# Patient Record
Sex: Male | Born: 1945
Health system: Southern US, Community
[De-identification: ages and names within clinical notes are randomized; demographics above are authoritative.]

---

## 2011-04-04 ENCOUNTER — Other Ambulatory Visit: Payer: Self-pay | Admitting: Occupational Medicine

## 2011-04-04 ENCOUNTER — Ambulatory Visit: Payer: Self-pay

## 2011-04-04 DIAGNOSIS — R7611 Nonspecific reaction to tuberculin skin test without active tuberculosis: Secondary | ICD-10-CM

## 2011-05-16 ENCOUNTER — Ambulatory Visit
Admission: RE | Admit: 2011-05-16 | Discharge: 2011-05-16 | Disposition: A | Discharge status: Home / Self Care | Payer: Medicare Other | Source: Ambulatory Visit | Attending: Family Medicine | Admitting: Family Medicine

## 2011-05-16 ENCOUNTER — Other Ambulatory Visit: Payer: Self-pay | Admitting: Family Medicine

## 2011-05-16 DIAGNOSIS — Z111 Encounter for screening for respiratory tuberculosis: Secondary | ICD-10-CM

## 2012-05-29 DIAGNOSIS — K6289 Other specified diseases of anus and rectum: Secondary | ICD-10-CM | POA: Diagnosis not present

## 2012-07-30 DIAGNOSIS — Z0000 Encounter for general adult medical examination without abnormal findings: Secondary | ICD-10-CM | POA: Diagnosis not present

## 2012-07-30 DIAGNOSIS — Z23 Encounter for immunization: Secondary | ICD-10-CM | POA: Diagnosis not present

## 2012-07-30 DIAGNOSIS — Z136 Encounter for screening for cardiovascular disorders: Secondary | ICD-10-CM | POA: Diagnosis not present

## 2012-07-30 DIAGNOSIS — Z131 Encounter for screening for diabetes mellitus: Secondary | ICD-10-CM | POA: Diagnosis not present

## 2012-07-30 IMAGING — CR DG CHEST 2V
2 series · 2 of 2 positions shown · non-contrast
Comparison: None

CLINICAL DATA: Positive PPD.

CHEST - 2 VIEW

[w chest pa]
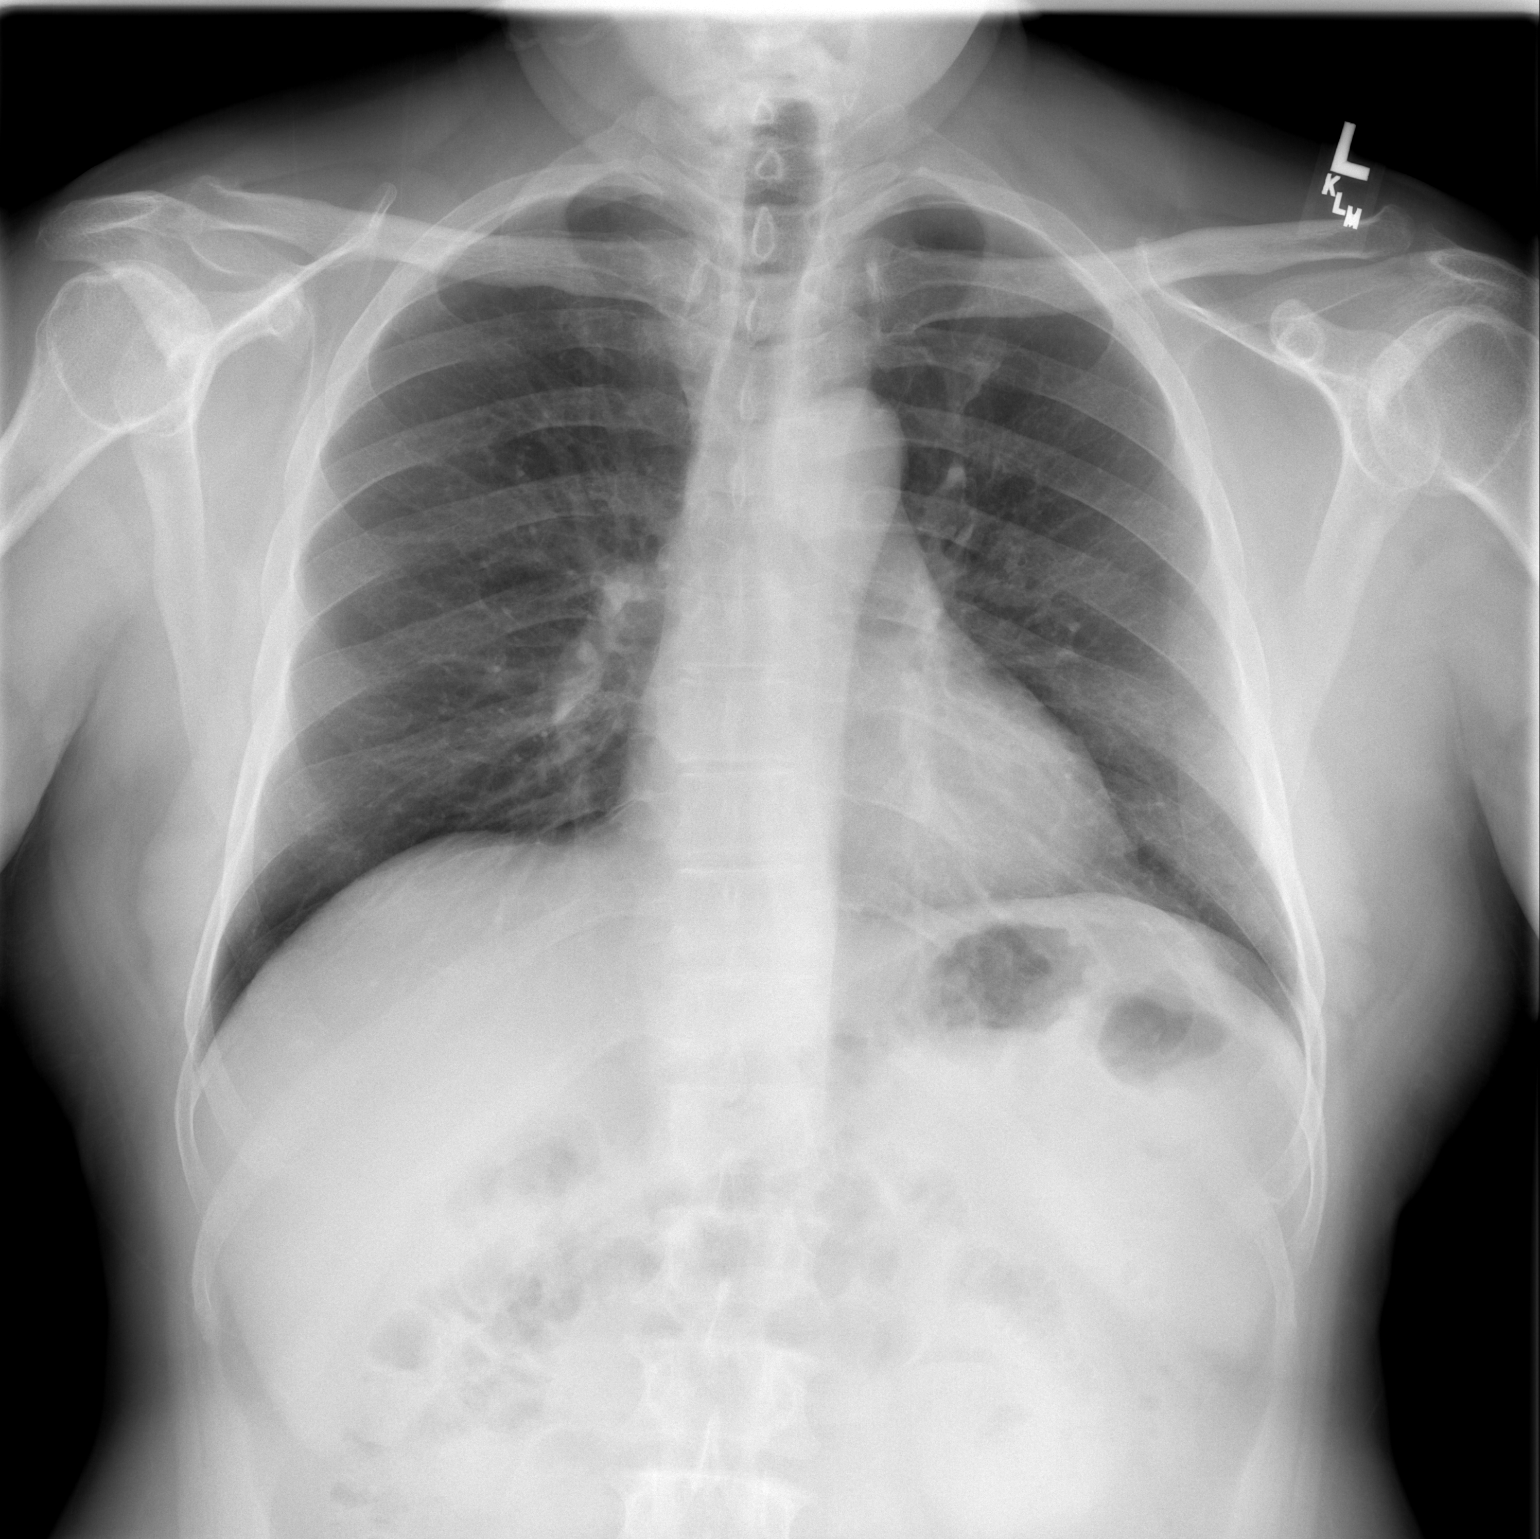

[w chest lat]
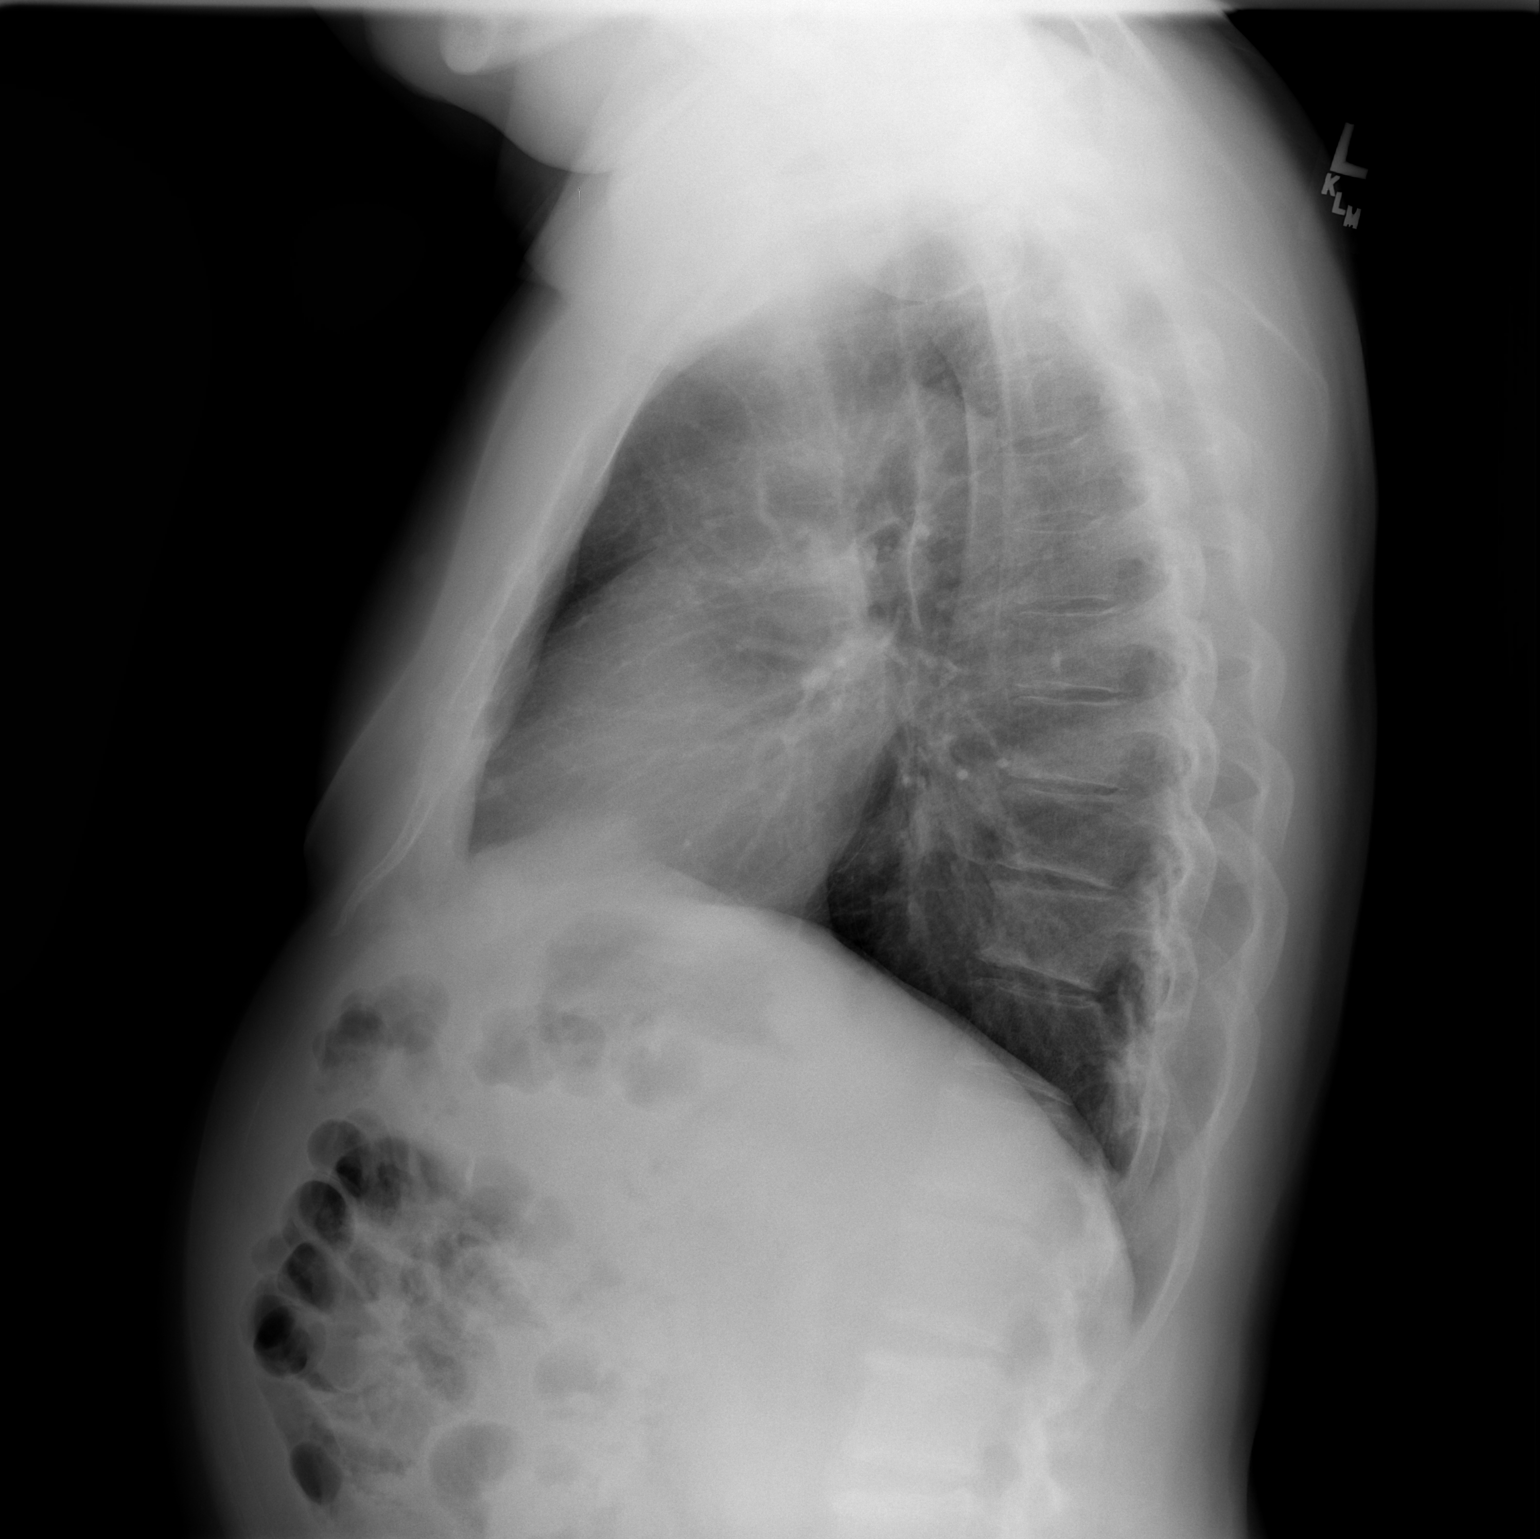

[2 of 2 positions shown; findings below may reference images not displayed]

FINDINGS: The cardiac silhouette, mediastinal and hilar contours
are within normal limits.  The lungs are clear.  The bony thorax is
intact.
IMPRESSION: No acute cardiopulmonary findings.

## 2012-09-23 DIAGNOSIS — Z23 Encounter for immunization: Secondary | ICD-10-CM | POA: Diagnosis not present

## 2013-08-18 DIAGNOSIS — N289 Disorder of kidney and ureter, unspecified: Secondary | ICD-10-CM | POA: Diagnosis not present

## 2013-08-18 DIAGNOSIS — E039 Hypothyroidism, unspecified: Secondary | ICD-10-CM | POA: Diagnosis not present

## 2014-03-03 DIAGNOSIS — Z136 Encounter for screening for cardiovascular disorders: Secondary | ICD-10-CM | POA: Diagnosis not present

## 2014-03-03 DIAGNOSIS — Z23 Encounter for immunization: Secondary | ICD-10-CM | POA: Diagnosis not present

## 2014-03-03 DIAGNOSIS — Z0000 Encounter for general adult medical examination without abnormal findings: Secondary | ICD-10-CM | POA: Diagnosis not present

## 2014-03-03 DIAGNOSIS — K5900 Constipation, unspecified: Secondary | ICD-10-CM | POA: Diagnosis not present

## 2014-03-03 DIAGNOSIS — Z131 Encounter for screening for diabetes mellitus: Secondary | ICD-10-CM | POA: Diagnosis not present

## 2014-03-03 DIAGNOSIS — R300 Dysuria: Secondary | ICD-10-CM | POA: Diagnosis not present

## 2014-06-19 DIAGNOSIS — R6889 Other general symptoms and signs: Secondary | ICD-10-CM | POA: Diagnosis not present

## 2015-04-06 DIAGNOSIS — Z0000 Encounter for general adult medical examination without abnormal findings: Secondary | ICD-10-CM | POA: Diagnosis not present

## 2015-04-06 DIAGNOSIS — Z131 Encounter for screening for diabetes mellitus: Secondary | ICD-10-CM | POA: Diagnosis not present

## 2015-04-06 DIAGNOSIS — R030 Elevated blood-pressure reading, without diagnosis of hypertension: Secondary | ICD-10-CM | POA: Diagnosis not present

## 2015-04-06 DIAGNOSIS — Z136 Encounter for screening for cardiovascular disorders: Secondary | ICD-10-CM | POA: Diagnosis not present

## 2015-04-12 DIAGNOSIS — Z1211 Encounter for screening for malignant neoplasm of colon: Secondary | ICD-10-CM | POA: Diagnosis not present

## 2016-05-02 DIAGNOSIS — R030 Elevated blood-pressure reading, without diagnosis of hypertension: Secondary | ICD-10-CM | POA: Diagnosis not present

## 2016-05-02 DIAGNOSIS — H538 Other visual disturbances: Secondary | ICD-10-CM | POA: Diagnosis not present

## 2016-05-02 DIAGNOSIS — E7800 Pure hypercholesterolemia, unspecified: Secondary | ICD-10-CM | POA: Diagnosis not present

## 2016-05-02 DIAGNOSIS — Z0000 Encounter for general adult medical examination without abnormal findings: Secondary | ICD-10-CM | POA: Diagnosis not present

## 2016-05-02 DIAGNOSIS — Z79899 Other long term (current) drug therapy: Secondary | ICD-10-CM | POA: Diagnosis not present

## 2016-05-05 DIAGNOSIS — Z1211 Encounter for screening for malignant neoplasm of colon: Secondary | ICD-10-CM | POA: Diagnosis not present

## 2016-06-28 DIAGNOSIS — Z961 Presence of intraocular lens: Secondary | ICD-10-CM | POA: Diagnosis not present

## 2016-06-28 DIAGNOSIS — H26491 Other secondary cataract, right eye: Secondary | ICD-10-CM | POA: Diagnosis not present

## 2016-06-28 DIAGNOSIS — H2512 Age-related nuclear cataract, left eye: Secondary | ICD-10-CM | POA: Diagnosis not present

## 2016-06-28 DIAGNOSIS — H25012 Cortical age-related cataract, left eye: Secondary | ICD-10-CM | POA: Diagnosis not present

## 2016-06-28 DIAGNOSIS — H35033 Hypertensive retinopathy, bilateral: Secondary | ICD-10-CM | POA: Diagnosis not present

## 2017-05-07 DIAGNOSIS — Z79899 Other long term (current) drug therapy: Secondary | ICD-10-CM | POA: Diagnosis not present

## 2017-05-07 DIAGNOSIS — Z0000 Encounter for general adult medical examination without abnormal findings: Secondary | ICD-10-CM | POA: Diagnosis not present

## 2017-05-07 DIAGNOSIS — E7800 Pure hypercholesterolemia, unspecified: Secondary | ICD-10-CM | POA: Diagnosis not present

## 2017-05-07 DIAGNOSIS — Z298 Encounter for other specified prophylactic measures: Secondary | ICD-10-CM | POA: Diagnosis not present

## 2017-05-07 DIAGNOSIS — R030 Elevated blood-pressure reading, without diagnosis of hypertension: Secondary | ICD-10-CM | POA: Diagnosis not present

## 2017-05-10 DIAGNOSIS — Z1211 Encounter for screening for malignant neoplasm of colon: Secondary | ICD-10-CM | POA: Diagnosis not present

## 2017-05-10 DIAGNOSIS — Z0000 Encounter for general adult medical examination without abnormal findings: Secondary | ICD-10-CM | POA: Diagnosis not present

## 2017-07-17 DIAGNOSIS — H40013 Open angle with borderline findings, low risk, bilateral: Secondary | ICD-10-CM | POA: Diagnosis not present

## 2017-07-17 DIAGNOSIS — H26491 Other secondary cataract, right eye: Secondary | ICD-10-CM | POA: Diagnosis not present

## 2017-07-17 DIAGNOSIS — Z961 Presence of intraocular lens: Secondary | ICD-10-CM | POA: Diagnosis not present

## 2017-07-17 DIAGNOSIS — H2512 Age-related nuclear cataract, left eye: Secondary | ICD-10-CM | POA: Diagnosis not present

## 2017-08-07 DIAGNOSIS — H26491 Other secondary cataract, right eye: Secondary | ICD-10-CM | POA: Diagnosis not present

## 2017-08-17 DIAGNOSIS — E7800 Pure hypercholesterolemia, unspecified: Secondary | ICD-10-CM | POA: Diagnosis not present

## 2017-08-21 DIAGNOSIS — S86911A Strain of unspecified muscle(s) and tendon(s) at lower leg level, right leg, initial encounter: Secondary | ICD-10-CM | POA: Diagnosis not present

## 2017-09-18 DIAGNOSIS — H25042 Posterior subcapsular polar age-related cataract, left eye: Secondary | ICD-10-CM | POA: Diagnosis not present

## 2017-09-18 DIAGNOSIS — H25012 Cortical age-related cataract, left eye: Secondary | ICD-10-CM | POA: Diagnosis not present

## 2017-09-18 DIAGNOSIS — Z961 Presence of intraocular lens: Secondary | ICD-10-CM | POA: Diagnosis not present

## 2017-09-18 DIAGNOSIS — H35033 Hypertensive retinopathy, bilateral: Secondary | ICD-10-CM | POA: Diagnosis not present

## 2017-09-18 DIAGNOSIS — H2512 Age-related nuclear cataract, left eye: Secondary | ICD-10-CM | POA: Diagnosis not present

## 2017-10-03 DIAGNOSIS — H25812 Combined forms of age-related cataract, left eye: Secondary | ICD-10-CM | POA: Diagnosis not present

## 2017-10-03 DIAGNOSIS — H2512 Age-related nuclear cataract, left eye: Secondary | ICD-10-CM | POA: Diagnosis not present

## 2017-10-03 DIAGNOSIS — H2181 Floppy iris syndrome: Secondary | ICD-10-CM | POA: Diagnosis not present

## 2018-05-01 DIAGNOSIS — H40013 Open angle with borderline findings, low risk, bilateral: Secondary | ICD-10-CM | POA: Diagnosis not present

## 2018-05-01 DIAGNOSIS — Z961 Presence of intraocular lens: Secondary | ICD-10-CM | POA: Diagnosis not present

## 2018-05-01 DIAGNOSIS — H35033 Hypertensive retinopathy, bilateral: Secondary | ICD-10-CM | POA: Diagnosis not present

## 2018-05-01 DIAGNOSIS — H02833 Dermatochalasis of right eye, unspecified eyelid: Secondary | ICD-10-CM | POA: Diagnosis not present

## 2018-05-20 DIAGNOSIS — Z136 Encounter for screening for cardiovascular disorders: Secondary | ICD-10-CM | POA: Diagnosis not present

## 2018-05-20 DIAGNOSIS — R7301 Impaired fasting glucose: Secondary | ICD-10-CM | POA: Diagnosis not present

## 2018-05-20 DIAGNOSIS — Z0000 Encounter for general adult medical examination without abnormal findings: Secondary | ICD-10-CM | POA: Diagnosis not present

## 2018-05-20 DIAGNOSIS — R030 Elevated blood-pressure reading, without diagnosis of hypertension: Secondary | ICD-10-CM | POA: Diagnosis not present

## 2018-05-20 DIAGNOSIS — Z79899 Other long term (current) drug therapy: Secondary | ICD-10-CM | POA: Diagnosis not present

## 2018-05-20 DIAGNOSIS — E7800 Pure hypercholesterolemia, unspecified: Secondary | ICD-10-CM | POA: Diagnosis not present

## 2018-07-19 DIAGNOSIS — R140 Abdominal distension (gaseous): Secondary | ICD-10-CM | POA: Diagnosis not present

## 2018-08-23 DIAGNOSIS — Z23 Encounter for immunization: Secondary | ICD-10-CM | POA: Diagnosis not present

## 2018-09-20 DIAGNOSIS — R7303 Prediabetes: Secondary | ICD-10-CM | POA: Diagnosis not present

## 2019-02-06 DIAGNOSIS — H40013 Open angle with borderline findings, low risk, bilateral: Secondary | ICD-10-CM | POA: Diagnosis not present

## 2019-06-02 DIAGNOSIS — Z0001 Encounter for general adult medical examination with abnormal findings: Secondary | ICD-10-CM | POA: Diagnosis not present

## 2019-06-02 DIAGNOSIS — Z79899 Other long term (current) drug therapy: Secondary | ICD-10-CM | POA: Diagnosis not present

## 2019-06-02 DIAGNOSIS — Z1159 Encounter for screening for other viral diseases: Secondary | ICD-10-CM | POA: Diagnosis not present

## 2019-06-02 DIAGNOSIS — R7303 Prediabetes: Secondary | ICD-10-CM | POA: Diagnosis not present

## 2019-06-02 DIAGNOSIS — Z1211 Encounter for screening for malignant neoplasm of colon: Secondary | ICD-10-CM | POA: Diagnosis not present

## 2019-06-02 DIAGNOSIS — I10 Essential (primary) hypertension: Secondary | ICD-10-CM | POA: Diagnosis not present

## 2019-06-03 DIAGNOSIS — Z0001 Encounter for general adult medical examination with abnormal findings: Secondary | ICD-10-CM | POA: Diagnosis not present

## 2019-06-03 DIAGNOSIS — R7303 Prediabetes: Secondary | ICD-10-CM | POA: Diagnosis not present

## 2019-06-03 DIAGNOSIS — Z1211 Encounter for screening for malignant neoplasm of colon: Secondary | ICD-10-CM | POA: Diagnosis not present

## 2019-06-03 DIAGNOSIS — Z79899 Other long term (current) drug therapy: Secondary | ICD-10-CM | POA: Diagnosis not present

## 2019-06-03 DIAGNOSIS — Z1159 Encounter for screening for other viral diseases: Secondary | ICD-10-CM | POA: Diagnosis not present

## 2019-06-03 DIAGNOSIS — Z125 Encounter for screening for malignant neoplasm of prostate: Secondary | ICD-10-CM | POA: Diagnosis not present

## 2019-07-14 DIAGNOSIS — Z1211 Encounter for screening for malignant neoplasm of colon: Secondary | ICD-10-CM | POA: Diagnosis not present

## 2019-08-14 DIAGNOSIS — H40013 Open angle with borderline findings, low risk, bilateral: Secondary | ICD-10-CM | POA: Diagnosis not present

## 2019-08-14 DIAGNOSIS — H35033 Hypertensive retinopathy, bilateral: Secondary | ICD-10-CM | POA: Diagnosis not present

## 2019-08-14 DIAGNOSIS — Z961 Presence of intraocular lens: Secondary | ICD-10-CM | POA: Diagnosis not present

## 2019-12-20 ENCOUNTER — Ambulatory Visit: Payer: Medicare Other | Attending: Internal Medicine

## 2019-12-20 DIAGNOSIS — Z23 Encounter for immunization: Secondary | ICD-10-CM | POA: Diagnosis not present

## 2019-12-20 NOTE — Progress Notes (Signed)
   Covid-19 Vaccination Clinic  Name:  Randall Burke    MRN: [id number] DOB: 11/30/1946  12/20/2019  [name] was observed post Covid-19 immunization for 15 minutes without incidence. He was provided with Vaccine Information Sheet and instruction to access the V-Safe system.   [name] was instructed to call 911 with any severe reactions post vaccine: Marland Kitchen Difficulty breathing  . Swelling of your face and throat  . A fast heartbeat  . A bad rash all over your body  . Dizziness and weakness    Immunizations Administered    Name Date Dose VIS Date Route   Pfizer COVID-19 Vaccine 12/20/2019 12:52 PM 0.3 mL 11/14/2019 Intramuscular   Manufacturer: [employer], [employer]   Lot: [id number]   NDC: [address]-4

## 2020-01-06 ENCOUNTER — Ambulatory Visit: Payer: Medicare Other

## 2020-01-08 ENCOUNTER — Ambulatory Visit: Payer: Medicare Other | Attending: Internal Medicine

## 2020-01-08 DIAGNOSIS — Z23 Encounter for immunization: Secondary | ICD-10-CM

## 2020-01-08 NOTE — Progress Notes (Signed)
   Covid-19 Vaccination Clinic  Name:  Randall Burke    MRN: [id number] DOB: 08/07/1946  01/08/2020  Mr. [name] was observed post Covid-19 immunization for 15 minutes without incidence. He was provided with Vaccine Information Sheet and instruction to access the V-Safe system.   Mr. [name] was instructed to call 911 with any severe reactions post vaccine: Marland Kitchen Difficulty breathing  . Swelling of your face and throat  . A fast heartbeat  . A bad rash all over your body  . Dizziness and weakness    Immunizations Administered    Name Date Dose VIS Date Route   Pfizer COVID-19 Vaccine 01/08/2020  8:46 AM 0.3 mL 11/14/2019 Intramuscular   Manufacturer: [employer], [employer]   Lot: [id number]   NDC: [id number]

## 2020-02-12 DIAGNOSIS — H40013 Open angle with borderline findings, low risk, bilateral: Secondary | ICD-10-CM | POA: Diagnosis not present

## 2020-06-03 DIAGNOSIS — R7309 Other abnormal glucose: Secondary | ICD-10-CM | POA: Diagnosis not present

## 2020-06-03 DIAGNOSIS — Z0001 Encounter for general adult medical examination with abnormal findings: Secondary | ICD-10-CM | POA: Diagnosis not present

## 2020-06-03 DIAGNOSIS — E7800 Pure hypercholesterolemia, unspecified: Secondary | ICD-10-CM | POA: Diagnosis not present

## 2020-08-20 DIAGNOSIS — Z23 Encounter for immunization: Secondary | ICD-10-CM | POA: Diagnosis not present

## 2023-05-08 DIAGNOSIS — Z0001 Encounter for general adult medical examination with abnormal findings: Secondary | ICD-10-CM | POA: Diagnosis not present

## 2023-05-08 DIAGNOSIS — E038 Other specified hypothyroidism: Secondary | ICD-10-CM | POA: Diagnosis not present

## 2023-05-08 DIAGNOSIS — E7800 Pure hypercholesterolemia, unspecified: Secondary | ICD-10-CM | POA: Diagnosis not present

## 2023-05-08 DIAGNOSIS — R7303 Prediabetes: Secondary | ICD-10-CM | POA: Diagnosis not present

## 2023-05-08 DIAGNOSIS — R7309 Other abnormal glucose: Secondary | ICD-10-CM | POA: Diagnosis not present

## 2023-05-08 DIAGNOSIS — Z1211 Encounter for screening for malignant neoplasm of colon: Secondary | ICD-10-CM | POA: Diagnosis not present

## 2023-05-08 DIAGNOSIS — G500 Trigeminal neuralgia: Secondary | ICD-10-CM | POA: Diagnosis not present

## 2023-05-08 DIAGNOSIS — K029 Dental caries, unspecified: Secondary | ICD-10-CM | POA: Diagnosis not present

## 2023-05-08 DIAGNOSIS — Z79899 Other long term (current) drug therapy: Secondary | ICD-10-CM | POA: Diagnosis not present

## 2023-05-09 DIAGNOSIS — Z1211 Encounter for screening for malignant neoplasm of colon: Secondary | ICD-10-CM | POA: Diagnosis not present

## 2023-08-15 DIAGNOSIS — E038 Other specified hypothyroidism: Secondary | ICD-10-CM | POA: Diagnosis not present

## 2023-08-16 DIAGNOSIS — Z23 Encounter for immunization: Secondary | ICD-10-CM | POA: Diagnosis not present

## 2023-10-18 DIAGNOSIS — D3131 Benign neoplasm of right choroid: Secondary | ICD-10-CM | POA: Diagnosis not present

## 2023-10-18 DIAGNOSIS — H353131 Nonexudative age-related macular degeneration, bilateral, early dry stage: Secondary | ICD-10-CM | POA: Diagnosis not present

## 2023-10-18 DIAGNOSIS — H40013 Open angle with borderline findings, low risk, bilateral: Secondary | ICD-10-CM | POA: Diagnosis not present

## 2023-10-18 DIAGNOSIS — H524 Presbyopia: Secondary | ICD-10-CM | POA: Diagnosis not present

## 2023-10-18 DIAGNOSIS — H26492 Other secondary cataract, left eye: Secondary | ICD-10-CM | POA: Diagnosis not present

## 2024-04-17 DIAGNOSIS — H353131 Nonexudative age-related macular degeneration, bilateral, early dry stage: Secondary | ICD-10-CM | POA: Diagnosis not present

## 2024-04-17 DIAGNOSIS — H40013 Open angle with borderline findings, low risk, bilateral: Secondary | ICD-10-CM | POA: Diagnosis not present

## 2024-05-14 DIAGNOSIS — Z0000 Encounter for general adult medical examination without abnormal findings: Secondary | ICD-10-CM | POA: Diagnosis not present

## 2024-05-14 DIAGNOSIS — Z79899 Other long term (current) drug therapy: Secondary | ICD-10-CM | POA: Diagnosis not present

## 2024-05-14 DIAGNOSIS — I10 Essential (primary) hypertension: Secondary | ICD-10-CM | POA: Diagnosis not present

## 2024-05-14 DIAGNOSIS — Z1211 Encounter for screening for malignant neoplasm of colon: Secondary | ICD-10-CM | POA: Diagnosis not present

## 2024-05-14 DIAGNOSIS — E7800 Pure hypercholesterolemia, unspecified: Secondary | ICD-10-CM | POA: Diagnosis not present

## 2024-05-14 DIAGNOSIS — R7303 Prediabetes: Secondary | ICD-10-CM | POA: Diagnosis not present

## 2024-05-14 DIAGNOSIS — E038 Other specified hypothyroidism: Secondary | ICD-10-CM | POA: Diagnosis not present

## 2024-05-15 DIAGNOSIS — Z1211 Encounter for screening for malignant neoplasm of colon: Secondary | ICD-10-CM | POA: Diagnosis not present
